# Patient Record
Sex: Female | Born: 1960 | Race: Black or African American | Hispanic: No | Marital: Married | Smoking: Never smoker
Health system: Southern US, Community
[De-identification: ages and names within clinical notes are randomized; demographics above are authoritative.]

## PROBLEM LIST (undated history)

## (undated) DIAGNOSIS — J45909 Unspecified asthma, uncomplicated: Secondary | ICD-10-CM

## (undated) DIAGNOSIS — R7303 Prediabetes: Secondary | ICD-10-CM

## (undated) DIAGNOSIS — N920 Excessive and frequent menstruation with regular cycle: Secondary | ICD-10-CM

## (undated) HISTORY — DX: Excessive and frequent menstruation with regular cycle: N92.0

## (undated) HISTORY — PX: POLYPECTOMY: SHX149

## (undated) HISTORY — DX: Unspecified asthma, uncomplicated: J45.909

## (undated) HISTORY — PX: DILATION AND CURETTAGE OF UTERUS: SHX78

## (undated) HISTORY — PX: UMBILICAL HERNIA REPAIR: SHX196

---

## 1998-09-29 HISTORY — PX: CARPAL TUNNEL RELEASE: SHX101

## 2004-09-03 ENCOUNTER — Ambulatory Visit: Payer: Self-pay | Admitting: Otolaryngology

## 2005-02-25 ENCOUNTER — Ambulatory Visit: Payer: Self-pay | Admitting: Otolaryngology

## 2006-02-16 ENCOUNTER — Ambulatory Visit: Payer: Self-pay | Admitting: Internal Medicine

## 2006-09-14 ENCOUNTER — Emergency Department: Payer: Self-pay | Admitting: Emergency Medicine

## 2007-03-18 ENCOUNTER — Ambulatory Visit: Payer: Self-pay | Admitting: Internal Medicine

## 2007-11-17 ENCOUNTER — Emergency Department: Payer: Self-pay | Admitting: Emergency Medicine

## 2010-03-07 ENCOUNTER — Ambulatory Visit: Payer: Self-pay | Admitting: Internal Medicine

## 2010-04-29 ENCOUNTER — Ambulatory Visit: Payer: Self-pay | Admitting: Internal Medicine

## 2010-05-14 ENCOUNTER — Ambulatory Visit: Payer: Self-pay | Admitting: Internal Medicine

## 2010-05-30 ENCOUNTER — Ambulatory Visit: Payer: Self-pay | Admitting: Internal Medicine

## 2010-08-08 ENCOUNTER — Emergency Department: Payer: Self-pay | Admitting: Emergency Medicine

## 2010-08-11 ENCOUNTER — Emergency Department: Payer: Self-pay | Admitting: Emergency Medicine

## 2010-08-16 ENCOUNTER — Emergency Department: Payer: Self-pay | Admitting: Emergency Medicine

## 2012-12-23 ENCOUNTER — Ambulatory Visit: Payer: Self-pay | Admitting: Gastroenterology

## 2012-12-24 LAB — PATHOLOGY REPORT

## 2013-09-29 HISTORY — PX: BREAST BIOPSY: SHX20

## 2014-05-10 ENCOUNTER — Ambulatory Visit: Payer: Self-pay | Admitting: Internal Medicine

## 2014-05-12 ENCOUNTER — Ambulatory Visit: Payer: Self-pay | Admitting: Internal Medicine

## 2014-05-15 ENCOUNTER — Ambulatory Visit: Payer: Self-pay | Admitting: Internal Medicine

## 2014-05-18 LAB — PATHOLOGY REPORT

## 2014-11-20 ENCOUNTER — Ambulatory Visit: Payer: Self-pay | Admitting: Internal Medicine

## 2015-06-20 ENCOUNTER — Other Ambulatory Visit: Payer: Self-pay | Admitting: Internal Medicine

## 2015-06-20 DIAGNOSIS — Z1231 Encounter for screening mammogram for malignant neoplasm of breast: Secondary | ICD-10-CM

## 2015-06-27 ENCOUNTER — Ambulatory Visit: Payer: Self-pay

## 2015-07-10 ENCOUNTER — Ambulatory Visit
Admission: RE | Admit: 2015-07-10 | Discharge: 2015-07-10 | Disposition: A | Discharge status: Home / Self Care | Payer: Medicaid Other | Source: Ambulatory Visit | Attending: Internal Medicine | Admitting: Internal Medicine

## 2015-07-10 DIAGNOSIS — Z1231 Encounter for screening mammogram for malignant neoplasm of breast: Secondary | ICD-10-CM | POA: Insufficient documentation

## 2016-08-04 ENCOUNTER — Ambulatory Visit
Admission: RE | Admit: 2016-08-04 | Discharge: 2016-08-04 | Disposition: A | Discharge status: Home / Self Care | Payer: Self-pay | Source: Ambulatory Visit | Attending: Oncology | Admitting: Oncology

## 2016-08-04 ENCOUNTER — Ambulatory Visit: Payer: Self-pay | Attending: Oncology | Admitting: *Deleted

## 2016-08-04 VITALS — BP 131/84 | HR 73 | Temp 97.5°F | Ht 64.17 in | Wt 281.7 lb

## 2016-08-04 DIAGNOSIS — Z0000 Encounter for general adult medical examination without abnormal findings: Secondary | ICD-10-CM

## 2016-08-04 NOTE — Patient Instructions (Signed)
Gave patient hand-out, Women Staying Healthy, Active and Well from [facility], with education on breast health, pap smears, heart and colon health. 

## 2016-08-04 NOTE — Progress Notes (Signed)
Subjective:     Patient ID: Jade Gonzalez, female   DOB: 02/07/1961, 55 y.o.   MRN: [id number]  HPI   Review of Systems     Objective:   Physical Exam  Pulmonary/Chest: Right breast exhibits no inverted nipple, no mass, no nipple discharge, no skin change and no tenderness. Left breast exhibits no inverted nipple, no mass, no nipple discharge, no skin change and no tenderness. Breasts are symmetrical.  Very large pendulous breast       Assessment:     55 year old Black female presents to [facility] for clinical breast exam, and mammogram only.  Clinical breast exam unremarkable.  Patient has very large pendulous breast.  Taught self breast awareness.  Patient has been screened for eligibility.  She does not have any insurance, Medicare or Medicaid.  She also meets financial eligibility.  Hand-out given on the Affordable Care Act.    Plan:     Screening mammogram ordered.  Will follow-up per [facility] protocol.

## 2016-08-05 ENCOUNTER — Encounter: Payer: Self-pay | Admitting: *Deleted

## 2016-08-05 NOTE — Progress Notes (Signed)
Letter mailed from [facility] to inform patient of her normal mammogram results.  Patient is to follow-up with annual screening in one year.  HSIS to Christy. 

## 2019-08-02 ENCOUNTER — Other Ambulatory Visit: Payer: Self-pay | Admitting: Internal Medicine

## 2019-08-02 DIAGNOSIS — Z1231 Encounter for screening mammogram for malignant neoplasm of breast: Secondary | ICD-10-CM

## 2020-02-01 ENCOUNTER — Ambulatory Visit
Admission: RE | Admit: 2020-02-01 | Discharge: 2020-02-01 | Disposition: A | Discharge status: Home / Self Care | Payer: Medicare Other | Source: Ambulatory Visit | Attending: Internal Medicine | Admitting: Internal Medicine

## 2020-02-01 DIAGNOSIS — Z1231 Encounter for screening mammogram for malignant neoplasm of breast: Secondary | ICD-10-CM | POA: Diagnosis present

## 2021-05-10 ENCOUNTER — Other Ambulatory Visit: Payer: Self-pay | Admitting: Internal Medicine

## 2021-05-10 DIAGNOSIS — Z1231 Encounter for screening mammogram for malignant neoplasm of breast: Secondary | ICD-10-CM

## 2021-07-09 ENCOUNTER — Ambulatory Visit
Admission: RE | Admit: 2021-07-09 | Discharge: 2021-07-09 | Disposition: A | Discharge status: Home / Self Care | Payer: Medicare Other | Source: Ambulatory Visit | Attending: Internal Medicine | Admitting: Internal Medicine

## 2021-07-09 ENCOUNTER — Other Ambulatory Visit: Payer: Self-pay

## 2021-07-09 DIAGNOSIS — Z1231 Encounter for screening mammogram for malignant neoplasm of breast: Secondary | ICD-10-CM | POA: Insufficient documentation

## 2022-04-28 ENCOUNTER — Other Ambulatory Visit: Payer: Self-pay

## 2022-06-03 ENCOUNTER — Other Ambulatory Visit: Payer: Self-pay | Admitting: Internal Medicine

## 2022-06-03 DIAGNOSIS — Z1231 Encounter for screening mammogram for malignant neoplasm of breast: Secondary | ICD-10-CM

## 2022-07-10 ENCOUNTER — Ambulatory Visit
Admission: RE | Admit: 2022-07-10 | Discharge: 2022-07-10 | Disposition: A | Discharge status: Home / Self Care | Payer: Medicare Other | Source: Ambulatory Visit | Attending: Internal Medicine | Admitting: Internal Medicine

## 2022-07-10 DIAGNOSIS — Z1231 Encounter for screening mammogram for malignant neoplasm of breast: Secondary | ICD-10-CM | POA: Diagnosis present

## 2022-07-29 ENCOUNTER — Encounter: Payer: Self-pay | Admitting: Pharmacy Technician

## 2022-07-29 NOTE — Patient Outreach (Signed)
Received 5 Boxes of Ozempic for patient.  Dr. Emily Filbert at [facility] ordered medication for patient.  Contacted [employer] office and made aware that medication needed to be picked-up.  Jacquelynn Cree Patient Advocate Specialist [place] at [facility]

## 2023-04-07 ENCOUNTER — Other Ambulatory Visit: Payer: Self-pay | Admitting: Internal Medicine

## 2023-04-07 DIAGNOSIS — M5116 Intervertebral disc disorders with radiculopathy, lumbar region: Secondary | ICD-10-CM

## 2023-04-15 ENCOUNTER — Ambulatory Visit
Admission: RE | Admit: 2023-04-15 | Discharge: 2023-04-15 | Disposition: A | Discharge status: Home / Self Care | Payer: Medicare Other | Source: Ambulatory Visit | Attending: Internal Medicine | Admitting: Internal Medicine

## 2023-04-15 DIAGNOSIS — M5116 Intervertebral disc disorders with radiculopathy, lumbar region: Secondary | ICD-10-CM | POA: Insufficient documentation

## 2023-07-02 ENCOUNTER — Other Ambulatory Visit: Payer: Self-pay | Admitting: Internal Medicine

## 2023-07-02 DIAGNOSIS — Z1231 Encounter for screening mammogram for malignant neoplasm of breast: Secondary | ICD-10-CM

## 2023-07-06 ENCOUNTER — Inpatient Hospital Stay
Admission: RE | Admit: 2023-07-06 | Discharge: 2023-07-06 | Disposition: A | Discharge status: Home / Self Care | Payer: Self-pay | Source: Ambulatory Visit | Attending: Neurosurgery | Admitting: Neurosurgery

## 2023-07-06 ENCOUNTER — Other Ambulatory Visit: Payer: Self-pay | Admitting: Family Medicine

## 2023-07-06 DIAGNOSIS — Z049 Encounter for examination and observation for unspecified reason: Secondary | ICD-10-CM

## 2023-07-10 NOTE — Progress Notes (Signed)
Referring Physician:  Danella Penton, MD [address] [facility] [place],  [place] [address]  Primary Physician:  Danella Penton, MD  History of Present Illness: 07/13/2023 Jade Gonzalez is here today with a chief complaint of  low back pain that radiates into the left buttock and into the leg and foot. No right leg pain.  She states that this has been going on for approximately the last 8 months.  She feels throbbing in her back and down her leg.  Approximately 7 out of 10 in severity.  Worsens with walking and upright activities.  She has not noted anything to make it better.  She has not noticed any sensation change or weakness.  No bowel or bladder dysfunction.  She has been working in [employer] but no formal therapy.  She has had injections as well as medical management.  Conservative measures: Physical therapy: water aerobics for 3 years, but no physical therapy Multimodal medical therapy including regular antiinflammatories:  celebrex, gabapentin     Injections: has received epidural steroid injections 05/15/2023: Left L4-5 and left S1 transforaminal ESI (unable to cannulate the left L5-S1 foramen)   I have utilized the care everywhere function in epic to review the outside records available from external health systems.  Review of Systems:  A 10 point review of systems is negative, except for the pertinent positives and negatives detailed in the HPI.  Past Medical History: Past Medical History:  Diagnosis Date   Asthma, moderate    Polymenorrhea     Past Surgical History: Past Surgical History:  Procedure Laterality Date   BREAST BIOPSY Right 2015   CARPAL TUNNEL RELEASE  2000   DILATION AND CURETTAGE OF UTERUS     POLYPECTOMY     UMBILICAL HERNIA REPAIR      Allergies: Allergies as of 07/13/2023 - Review Complete 07/13/2023  Allergen Reaction Noted   Etodolac Rash 02/07/2014    Medications:  Current Outpatient  Medications:    albuterol (VENTOLIN HFA) 108 (90 Base) MCG/ACT inhaler, Inhale 1 puff into the lungs every 4 (four) hours as needed., Disp: , Rfl:    celecoxib (CELEBREX) 200 MG capsule, Take 200 mg by mouth 2 (two) times daily., Disp: , Rfl:    fluticasone (FLONASE) 50 MCG/ACT nasal spray, Place 1 spray into both nostrils daily., Disp: , Rfl:    gabapentin (NEURONTIN) 100 MG capsule, Take 1 capsule by mouth at bedtime., Disp: , Rfl:    lovastatin (MEVACOR) 20 MG tablet, Take 20 mg by mouth at bedtime., Disp: , Rfl:    metFORMIN (GLUCOPHAGE) 500 MG tablet, Take 500 mg by mouth 2 (two) times daily with a meal., Disp: , Rfl:    montelukast (SINGULAIR) 10 MG tablet, Take 10 mg by mouth at bedtime., Disp: , Rfl:    omeprazole (PRILOSEC) 20 MG capsule, Take 20 mg by mouth 2 (two) times daily before a meal., Disp: , Rfl:    triamterene-hydrochlorothiazide (MAXZIDE-25) 37.5-25 MG tablet, Take 1 tablet by mouth daily., Disp: , Rfl:    cyanocobalamin (VITAMIN B12) 1000 MCG tablet, Take 1,000 mcg by mouth daily., Disp: , Rfl:   Social History: Social History   Tobacco Use   Smoking status: Never   Smokeless tobacco: Never  Substance Use Topics   Drug use: Never    Family Medical History: Family History  Problem Relation Age of Onset   Breast cancer Neg Hx     Physical Examination: Vitals:  07/13/23 0857  BP: 124/74    General: Patient is in no apparent distress. Attention to examination is appropriate.  Neck:   Supple.  Full range of motion.  Respiratory: Patient is breathing without any difficulty.   NEUROLOGICAL:     Awake, alert, oriented to person, place, and time.  Speech is clear and fluent.   Cranial Nerves: Pupils equal round and reactive to light.  Facial tone is symmetric.  Facial sensation is symmetric. Shoulder shrug is symmetric. Tongue protrusion is midline.    Strength:  Side Iliopsoas Quads Hamstring PF DF EHL  R 5 5 5 5 5 5   L 5 5 5  4+ 4+ 4+   Reflexes are  1+ and symmetric at the patella and achilles.   Hoffman's is absent. Clonus is absent  SLR Negative   Bilateral upper and lower extremity sensation is intact to light touch .     No evidence of dysmetria noted.  Gait is normal.    Imaging: Narrative & Impression  CLINICAL DATA:  Low back pain in both hips for 6 months   EXAM: MRI LUMBAR SPINE WITHOUT CONTRAST   TECHNIQUE: Multiplanar, multisequence MR imaging of the lumbar spine was performed. No intravenous contrast was administered.   COMPARISON:  None Available.   FINDINGS: Segmentation:  Standard.   Alignment: Grade 1 anterolisthesis of L4 on L5 secondary to facet disease. Grade 1 anterolisthesis of L3 on L4 secondary to facet disease.   Vertebrae: No acute fracture, evidence of discitis, or aggressive bone lesion.   Conus medullaris and cauda equina: Conus extends to the L1 level. Conus and cauda equina appear normal.   Paraspinal and other soft tissues: No acute paraspinal abnormality.   Disc levels:   Disc spaces: Degenerative disease with severe disc height loss and partial anterior interbody fusion at L5-S1. Mild degenerative disease with disc height loss and disc desiccation at L3-4 and L4-5. Degenerative disease with disc height loss at T11-12.   T11-12: Mild broad-based disc bulge. Mild bilateral facet arthropathy. Mild right foraminal stenosis. No left foraminal stenosis. No spinal stenosis.   T12-L1: No significant disc bulge. Mild bilateral facet arthropathy. No foraminal or central canal stenosis.   L1-L2: No significant disc bulge. Mild bilateral facet arthropathy. No foraminal or central canal stenosis.   L2-L3: Mild broad-based disc bulge. Mild bilateral facet arthropathy. No foraminal or central canal stenosis.   L3-L4: Broad-based disc bulge. Moderate bilateral facet arthropathy with ligamentum flavum infolding. Moderate spinal stenosis. Mild right and moderate left foraminal  stenosis.   L4-L5: Broad-based disc bulge eccentric towards the left. Moderate bilateral facet arthropathy with severe ligamentum flavum infolding. Severe spinal stenosis. Severe left and mild-moderate right foraminal stenosis.   L5-S1: Tiny central disc protrusion. No foraminal or central canal stenosis. Mild bilateral facet arthropathy.   IMPRESSION: 1. At L3-4 there is a broad-based disc bulge. Moderate bilateral facet arthropathy with ligamentum flavum infolding. Moderate spinal stenosis. Mild right and moderate left foraminal stenosis. 2. At L4-5 there is a broad-based disc bulge eccentric towards the left. Moderate bilateral facet arthropathy with severe ligamentum flavum infolding. Severe spinal stenosis. Severe left and mild-moderate right foraminal stenosis. 3. No acute osseous injury of the lumbar spine.     Electronically Signed   By: Elige Ko M.D.   On: 04/24/2023 07:42         I have personally reviewed the images and agree with the above interpretation.  Medical Decision Making/Assessment and Plan: Jade Gonzalez is a  pleasant 62 y.o. female with back pain radiating into her buttocks and down her leg.  She has been dealing with this for approximately 8 months.  She does have some mild weakness.  She has not noticed any significant sensory changes.  She states that this is worse while she is upright.  She has done extensive amounts of aqua aerobics but no formal physical therapy.  On physical exam she does show some mild weakness in the left lower extremity when compared to the left.  She states that she has not noticed this previously.  No signs or symptoms of cauda equina syndrome.  Her MRI shows spondylosis worse at L3-4 and L4-5 with some autofusion of L5-S1.  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  Given the significant amount of spondylosis would like to follow-up with the flexion-extension x-rays of the back as she does show some  secondary signs of possible instability.  We like to have her follow-up with physical therapy as well.  We like to see her back in approximately 2 months to gauge her improvement.  Thank you for involving me in the care of this patient.    Lovenia Kim MD/MSCR Neurosurgery

## 2023-07-13 ENCOUNTER — Encounter: Payer: Self-pay | Admitting: Neurosurgery

## 2023-07-13 ENCOUNTER — Ambulatory Visit (INDEPENDENT_AMBULATORY_CARE_PROVIDER_SITE_OTHER): Payer: Medicare Other | Admitting: Neurosurgery

## 2023-07-13 ENCOUNTER — Ambulatory Visit
Admission: RE | Admit: 2023-07-13 | Discharge: 2023-07-13 | Disposition: A | Discharge status: Home / Self Care | Payer: Medicare Other | Source: Ambulatory Visit | Attending: Neurosurgery | Admitting: Neurosurgery

## 2023-07-13 ENCOUNTER — Ambulatory Visit
Admission: RE | Admit: 2023-07-13 | Discharge: 2023-07-13 | Disposition: A | Discharge status: Home / Self Care | Payer: Medicare Other | Attending: Neurosurgery | Admitting: Neurosurgery

## 2023-07-13 VITALS — BP 124/74 | Ht 65.0 in | Wt 254.6 lb

## 2023-07-13 DIAGNOSIS — M5116 Intervertebral disc disorders with radiculopathy, lumbar region: Secondary | ICD-10-CM | POA: Diagnosis not present

## 2023-07-13 DIAGNOSIS — M4726 Other spondylosis with radiculopathy, lumbar region: Secondary | ICD-10-CM

## 2023-07-13 DIAGNOSIS — M5442 Lumbago with sciatica, left side: Secondary | ICD-10-CM | POA: Insufficient documentation

## 2023-07-13 DIAGNOSIS — M48061 Spinal stenosis, lumbar region without neurogenic claudication: Secondary | ICD-10-CM | POA: Diagnosis not present

## 2023-07-13 DIAGNOSIS — M4716 Other spondylosis with myelopathy, lumbar region: Secondary | ICD-10-CM

## 2023-07-13 DIAGNOSIS — G8929 Other chronic pain: Secondary | ICD-10-CM | POA: Insufficient documentation

## 2023-07-13 NOTE — Patient Instructions (Signed)
LOCAL PHYSICAL THERAPY  [facility]  [address]  [place], [place] [address]  [phone]  [facility]  [address] [place], [place] [address]  [phone]  [facility] (2 locations)  [address]  [place], [place] [address]  [phone]          or  [address]  [place], [place] [address]  [phone]  [facility]  [address]  [place], [place] [address]  [phone]  **dry needling**  [facility] ([facility])  [address]  [place], [place] [address]  [phone]  Fax: [phone]  ** Aquatic therapy[address] [place], [place] [address] [phone] **Aquatic therapy**  [facility]  [facility]  [address]  [place], [place] [address]  [phone]  [facility]  [address]  [place], [place] [address]  [phone]  [facility]  [address]   Janora Norlander  [place], [place] [address]  [phone]  [facility]  [address]  [place], [place] [address]  [phone]  **dry needling**   PELVIC FLOOR/SI JOINT  [facility]  Mariane Masters, PT  [email or web address]   [place]  [facility]  [address]  [address] [place], [place] [address]  [phone]   [facility] [place]  [address], [place] [address]  [phone]   [facility], [place]  [facility]  Raymond Gurney, PT  [address]  [place], [place] [address] [phone]   Samara Deist  [facility]  [address]  [phone]   [facility]  [address]  [phone]   [facility]  Annamaria Boots [facility]  [address]  [place], [place] [address]  [phone]   **No Aetna or medicaid**  [facility]  [phone] [address] [place] [address]  [phone]  [facility]  [address]  [place], [place] [address]  [phone]  Dannial Monarch, DC  [address], [place] [address]  [phone]  [facility]  [address] [place] [address]  [phone]  [facility]  [address]  [place], [place] [address]  [phone]  [facility]  (3 locations)  [address]  [place], [place] [address]  [phone]  **dry needling**           or  [address] [place], [place] [address]  [phone]  **Additionally has Gloris Manchester, OT**           or  [address]  [place], [place] [address]  [phone]  **Pediatric therapy**  [facility]  [address]  [phone]  **dry needlingVerdie Drown [facility]  [address]  [place], [place] [address]  [phone]  [facility]   (Inside [address] Fitness)  [address]  [place], [place] [address]  [phone]  **dry needling**  **MEDICAID or UNINSURED** [facility] dept. Of Physical Therapy [place], [place] [address] [phone]  [facility]  [address] [place], [place] [address]  [phone]   [facility]  [address]  [place], [place] [address]  [phone]   Doreatha Martin  [facility]  [address] [place], [place] [address]  [phone]   [facility]  [address]  [place], [place] [address]  [phone]   [facility]  [address] [place], [place] [address]  [phone]  [facility]  [address]  [place], [place] [address]  [phone]  AQUATIC  Kathalene Frames [facility]  [employer]  [facility]  [place]  [place]  *Residents only*   [facility] [employer]  *[facility]  Exercise class  [facility]  Exercise class  [facility]  [address]  [place], [place]   [phone]  [facility]  [address]  [place], [place] [address]  [phone]   [place], [place]  [facility]  [address]  [phone]   [facility]  [facility]  [address]  [phone]           or  [address]  [phone]   [facility]   Provides education and support and practical information for coping with arthritis for arthritis sufferers and their families.   When: 12:15 - 1:30 p.m. the second Monday of each month, March through December  Info: Call Rehabilitation Services at [phone]

## 2023-07-17 ENCOUNTER — Ambulatory Visit
Admission: RE | Admit: 2023-07-17 | Discharge: 2023-07-17 | Disposition: A | Discharge status: Home / Self Care | Payer: Medicare Other | Source: Ambulatory Visit | Attending: Internal Medicine | Admitting: Internal Medicine

## 2023-07-17 DIAGNOSIS — Z1231 Encounter for screening mammogram for malignant neoplasm of breast: Secondary | ICD-10-CM | POA: Diagnosis present

## 2023-09-16 ENCOUNTER — Ambulatory Visit: Payer: Medicare Other | Admitting: Neurosurgery

## 2023-10-03 IMAGING — MG MM DIGITAL SCREENING BILAT W/ TOMO AND CAD
8 of 14 series · 8 of 40 positions shown · non-contrast
Comparison: Previous exam(s).

ACR Breast Density Category a: The breast tissue is almost entirely
fatty.

CLINICAL DATA: Screening.

EXAM:
DIGITAL SCREENING BILATERAL MAMMOGRAM WITH TOMOSYNTHESIS AND CAD
TECHNIQUE: Bilateral screening digital craniocaudal and mediolateral oblique
mammograms were obtained. Bilateral screening digital breast
tomosynthesis was performed. The images were evaluated with
computer-aided detection.

[L MLO synth-2D (1 of 2)]
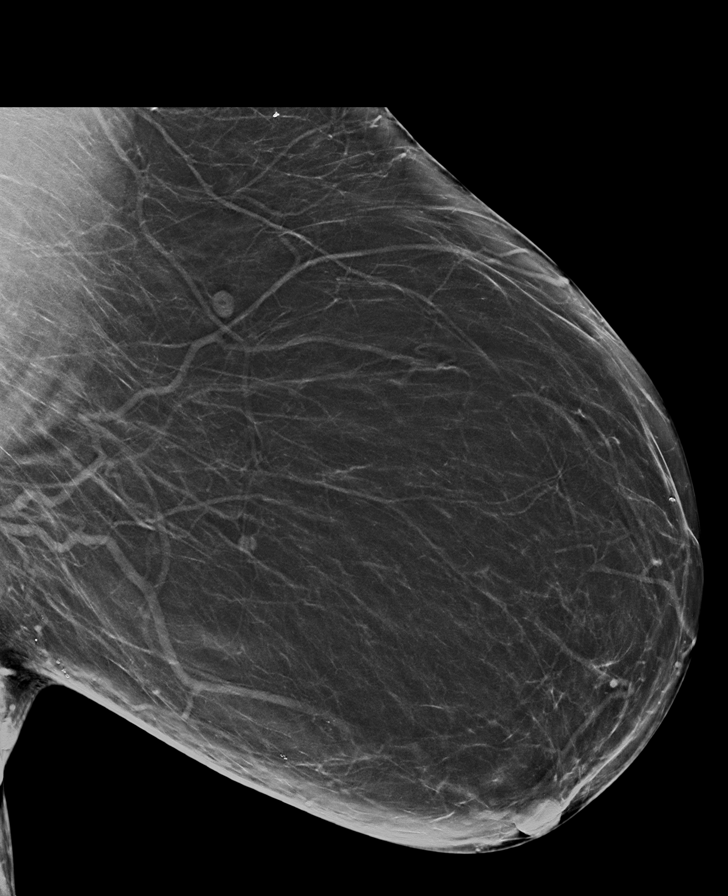

[R MLO synth-2D (1 of 2)]
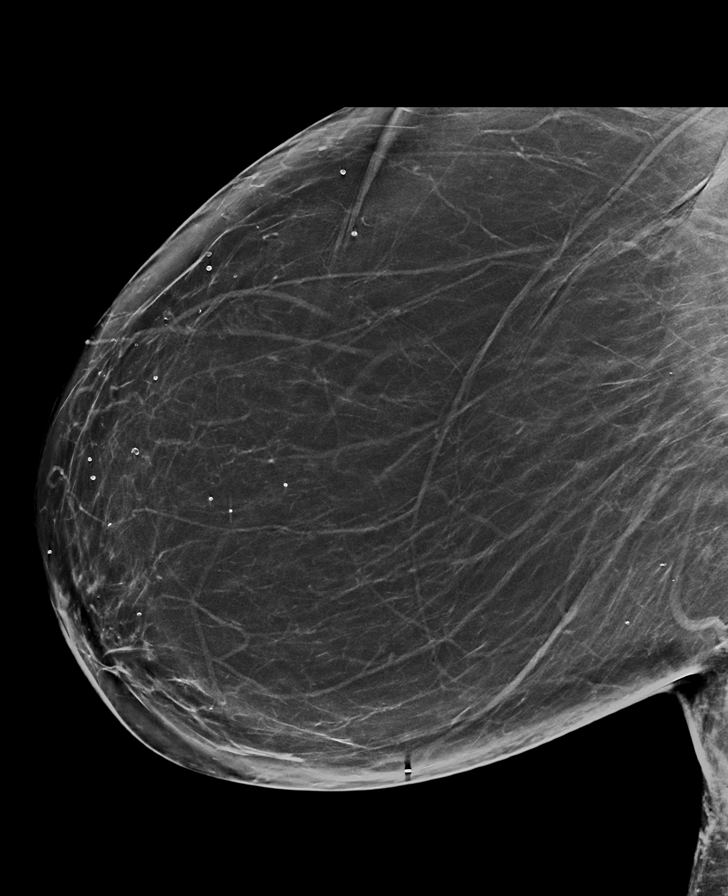

[R CC synth-2D (1 of 2)]
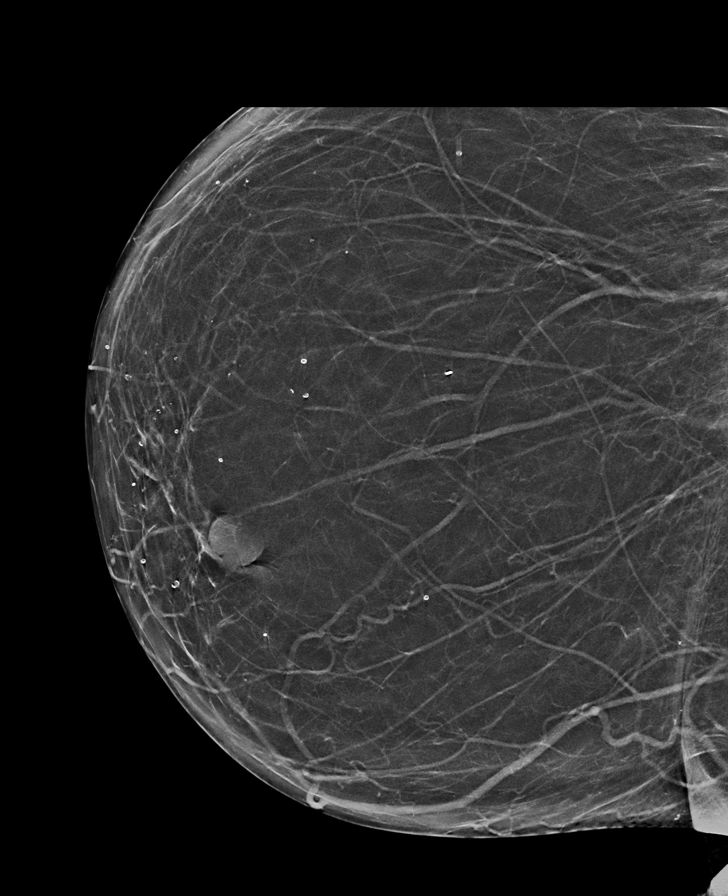

[R MLO synth-2D (2 of 2)]
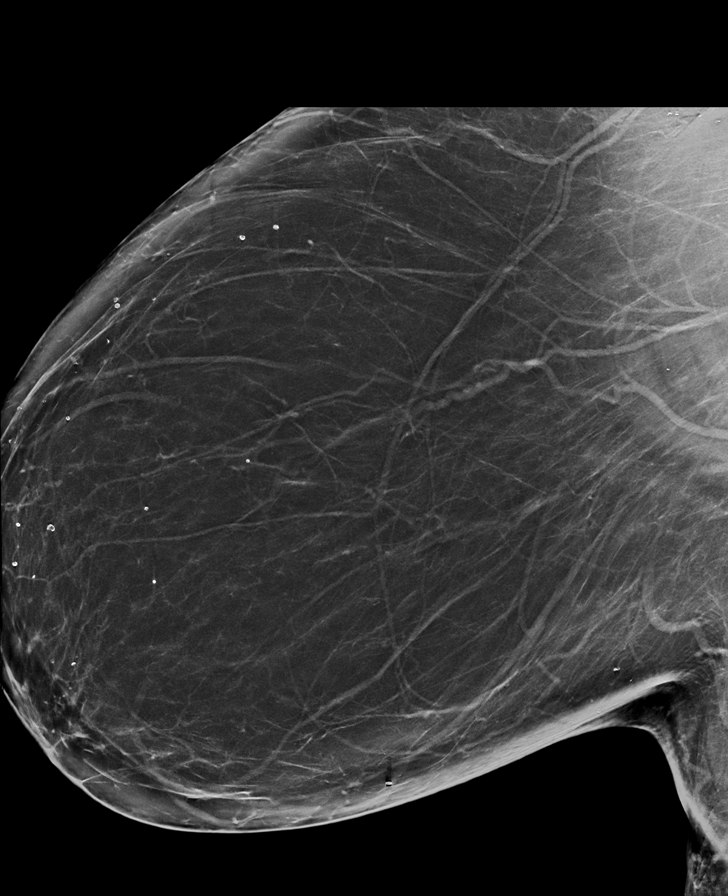

[L MLO synth-2D (2 of 2)]
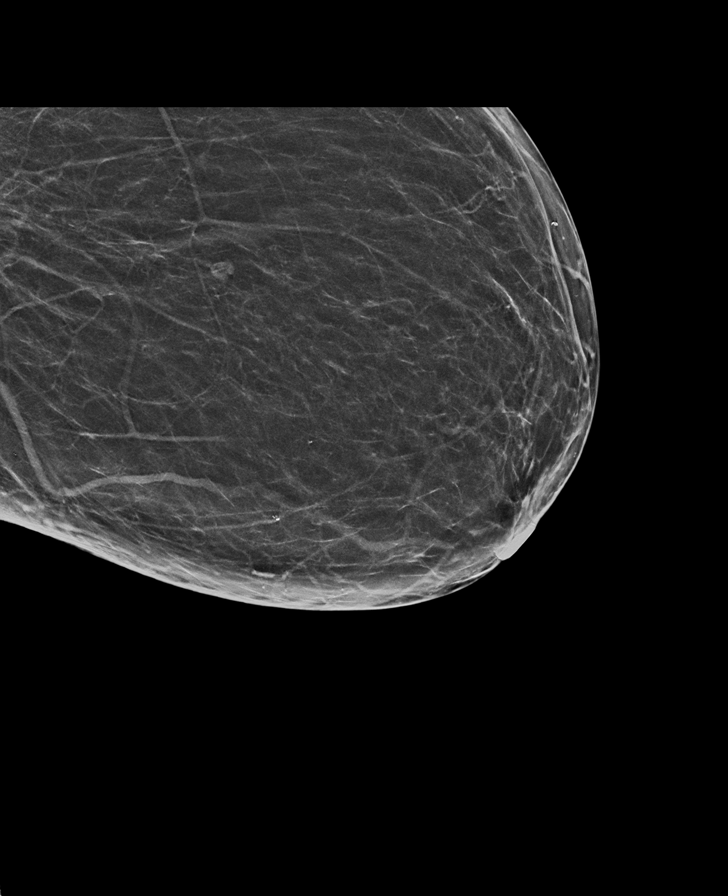

[L CC synth-2D]
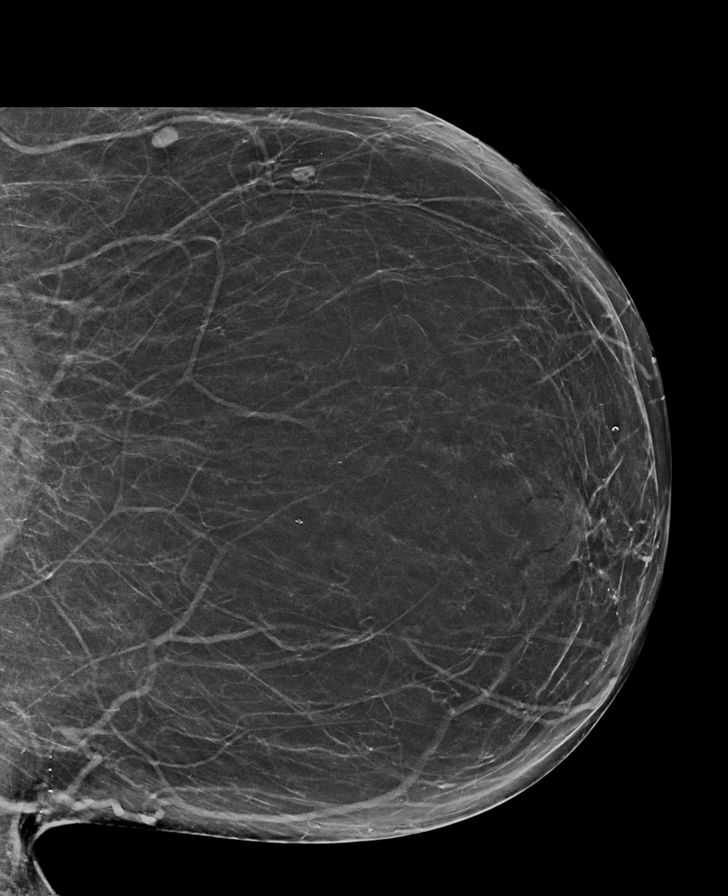

[R CC synth-2D (2 of 2)]
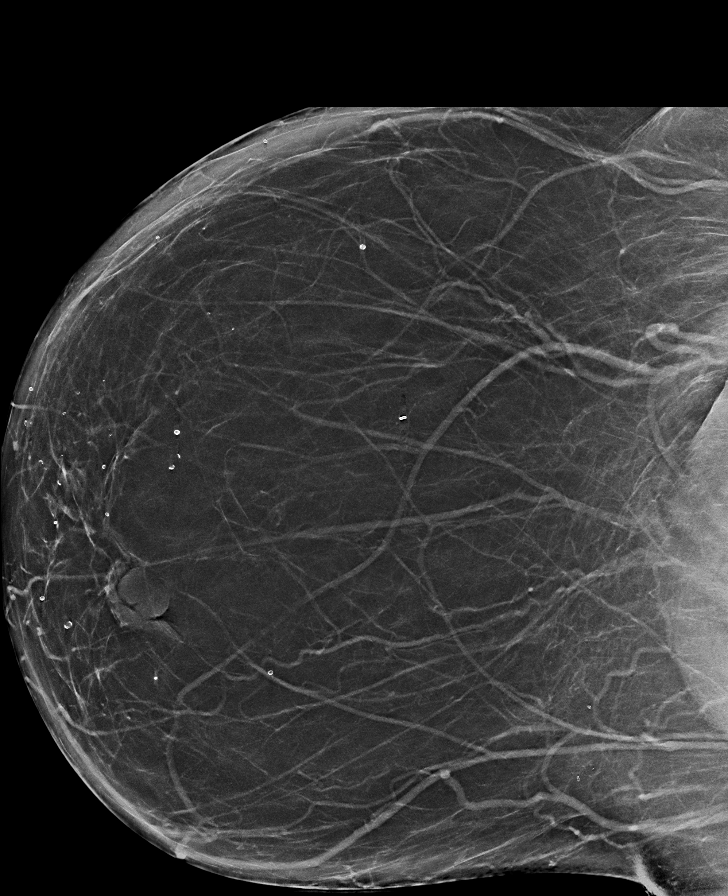

[R MLO tomo · tomo slice 41/82.0]
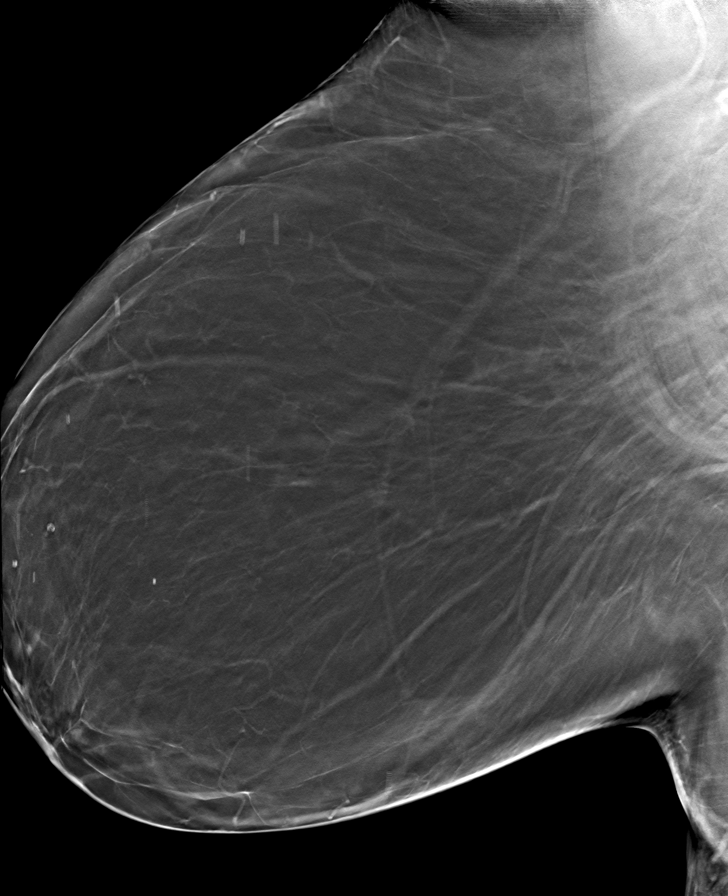

[8 of 40 positions shown; findings below may reference images not displayed]

FINDINGS: There are no findings suspicious for malignancy.
IMPRESSION: No mammographic evidence of malignancy. A result letter of this
screening mammogram will be mailed directly to the patient.

RECOMMENDATION:
Screening mammogram in one year. (Code:0E-3-N98)

BI-RADS CATEGORY  1: Negative.

## 2023-11-04 ENCOUNTER — Ambulatory Visit: Payer: Medicare Other | Admitting: Neurosurgery

## 2024-06-21 ENCOUNTER — Other Ambulatory Visit: Payer: Self-pay | Admitting: Internal Medicine

## 2024-06-21 DIAGNOSIS — Z1231 Encounter for screening mammogram for malignant neoplasm of breast: Secondary | ICD-10-CM

## 2024-07-20 ENCOUNTER — Ambulatory Visit
Admission: RE | Admit: 2024-07-20 | Discharge: 2024-07-20 | Disposition: A | Discharge status: Home / Self Care | Source: Ambulatory Visit | Attending: Internal Medicine | Admitting: Internal Medicine

## 2024-07-20 DIAGNOSIS — Z1231 Encounter for screening mammogram for malignant neoplasm of breast: Secondary | ICD-10-CM | POA: Insufficient documentation

## 2024-10-19 ENCOUNTER — Encounter
Admission: RE | Disposition: A | Discharge status: Home / Self Care | Payer: Self-pay | Source: Home / Self Care | Attending: Gastroenterology

## 2024-10-19 ENCOUNTER — Ambulatory Visit: Admitting: Anesthesiology

## 2024-10-19 ENCOUNTER — Encounter: Payer: Self-pay | Admitting: Gastroenterology

## 2024-10-19 ENCOUNTER — Ambulatory Visit
Admission: RE | Admit: 2024-10-19 | Discharge: 2024-10-19 | Disposition: A | Discharge status: Home / Self Care | Attending: Gastroenterology | Admitting: Gastroenterology

## 2024-10-19 ENCOUNTER — Other Ambulatory Visit: Payer: Self-pay

## 2024-10-19 DIAGNOSIS — Z6841 Body Mass Index (BMI) 40.0 and over, adult: Secondary | ICD-10-CM | POA: Insufficient documentation

## 2024-10-19 DIAGNOSIS — E66813 Obesity, class 3: Secondary | ICD-10-CM | POA: Insufficient documentation

## 2024-10-19 DIAGNOSIS — Z860101 Personal history of adenomatous and serrated colon polyps: Secondary | ICD-10-CM | POA: Insufficient documentation

## 2024-10-19 DIAGNOSIS — Z1211 Encounter for screening for malignant neoplasm of colon: Secondary | ICD-10-CM | POA: Insufficient documentation

## 2024-10-19 DIAGNOSIS — J4540 Moderate persistent asthma, uncomplicated: Secondary | ICD-10-CM | POA: Insufficient documentation

## 2024-10-19 DIAGNOSIS — Z7984 Long term (current) use of oral hypoglycemic drugs: Secondary | ICD-10-CM | POA: Diagnosis not present

## 2024-10-19 DIAGNOSIS — E119 Type 2 diabetes mellitus without complications: Secondary | ICD-10-CM | POA: Insufficient documentation

## 2024-10-19 HISTORY — DX: Prediabetes: R73.03

## 2024-10-19 HISTORY — PX: COLONOSCOPY: SHX5424

## 2024-10-19 LAB — GLUCOSE, CAPILLARY: Glucose-Capillary: 98 mg/dL (ref 70–99)

## 2024-10-19 MED ORDER — DEXMEDETOMIDINE HCL IN NACL 200 MCG/50ML IV SOLN
INTRAVENOUS | Status: DC | PRN
  Administered 2024-10-19 (×2): 12 ug via INTRAVENOUS

## 2024-10-19 MED ORDER — SODIUM CHLORIDE 0.9 % IV SOLN: INTRAVENOUS | Status: DC

## 2024-10-19 MED ORDER — PROPOFOL 10 MG/ML IV BOLUS
INTRAVENOUS | Status: DC | PRN
  Administered 2024-10-19 (×2): 20 mg via INTRAVENOUS
  Administered 2024-10-19: 60 mg via INTRAVENOUS

## 2024-10-19 MED ORDER — ALBUTEROL SULFATE HFA 108 (90 BASE) MCG/ACT IN AERS
INHALATION_SPRAY | RESPIRATORY_TRACT | Status: DC | PRN
  Administered 2024-10-19: 2 via RESPIRATORY_TRACT

## 2024-10-19 MED ORDER — LIDOCAINE HCL (CARDIAC) PF 100 MG/5ML IV SOSY
PREFILLED_SYRINGE | INTRAVENOUS | Status: DC | PRN
  Administered 2024-10-19: 100 mg via INTRAVENOUS

## 2024-10-19 MED ORDER — PROPOFOL 500 MG/50ML IV EMUL
INTRAVENOUS | Status: DC | PRN
  Administered 2024-10-19: 165 ug/kg/min via INTRAVENOUS

## 2024-10-19 MED ORDER — GLYCOPYRROLATE 0.2 MG/ML IJ SOLN
INTRAMUSCULAR | Status: DC | PRN
  Administered 2024-10-19: .2 mg via INTRAVENOUS

## 2024-10-19 NOTE — Transfer of Care (Signed)
 Immediate Anesthesia Transfer of Care Note  Patient: Jade Gonzalez  Procedure(s) Performed: COLONOSCOPY  Patient Location: Endoscopy Unit  Anesthesia Type:General  Level of Consciousness: drowsy and patient cooperative  Airway & Oxygen Therapy: Patient Spontanous Breathing and Patient connected to face mask oxygen  Post-op Assessment: Report given to RN and Post -op Vital signs reviewed and stable  Post vital signs: Reviewed and stable  Last Vitals:  Vitals Value Taken Time  BP    Temp    Pulse    Resp    SpO2      Last Pain:  Vitals:   10/19/24 1134  TempSrc: (P) Temporal  PainSc:          Complications: No notable events documented.

## 2024-10-19 NOTE — Anesthesia Postprocedure Evaluation (Signed)
"   Anesthesia Post Note  Patient: Jade Gonzalez  Procedure(s) Performed: COLONOSCOPY  Patient location during evaluation: PACU Anesthesia Type: General Level of consciousness: awake and alert Pain management: satisfactory to patient Vital Signs Assessment: post-procedure vital signs reviewed and stable Respiratory status: spontaneous breathing Cardiovascular status: stable Anesthetic complications: no   No notable events documented.   Last Vitals:  Vitals:   10/19/24 1134 10/19/24 1144  BP: (!) 92/54 104/77  Pulse: 88 87  Resp: (!) 21 17  Temp: (!) 35.7 C   SpO2: 100% 98%    Last Pain:  Vitals:   10/19/24 1144  TempSrc:   PainSc: 0-No pain                 VAN STAVEREN,Kennan Detter      "

## 2024-10-19 NOTE — Anesthesia Procedure Notes (Signed)
 Procedure Name: General with mask airway Date/Time: 10/19/2024 11:15 AM  Performed by: Ledora Duncan, CRNAPre-anesthesia Checklist: Patient identified, Emergency Drugs available, Suction available and Patient being monitored Patient Re-evaluated:Patient Re-evaluated prior to induction Oxygen Delivery Method: Simple face mask Induction Type: IV induction Placement Confirmation: positive ETCO2 and breath sounds checked- equal and bilateral Dental Injury: Teeth and Oropharynx as per pre-operative assessment

## 2024-10-19 NOTE — H&P (Signed)
 "  Ruel Kung , MD [address], [place], [place], [address] Phone: [phone] Fax: [phone]  Primary Care Physician:  Cleotilde Oneil FALCON, MD   Pre-Procedure History & Physical: HPI:  Jade Gonzalez is a 64 y.o. female is here for an colonoscopy.   Past Medical History:  Diagnosis Date   Asthma, moderate    Polymenorrhea    Pre-diabetes     Past Surgical History:  Procedure Laterality Date   BREAST BIOPSY Right 2015   CARPAL TUNNEL RELEASE  2000   DILATION AND CURETTAGE OF UTERUS     POLYPECTOMY     UMBILICAL HERNIA REPAIR      Prior to Admission medications  Medication Sig Start Date End Date Taking? Authorizing Provider  albuterol  (VENTOLIN  HFA) 108 (90 Base) MCG/ACT inhaler Inhale 1 puff into the lungs every 4 (four) hours as needed. 06/09/23  Yes [provider]  cyanocobalamin (VITAMIN B12) 1000 MCG tablet Take 1,000 mcg by mouth daily.   Yes [provider]  lovastatin (MEVACOR) 20 MG tablet Take 20 mg by mouth at bedtime. 06/09/23  Yes [provider]  metFORMIN (GLUCOPHAGE) 500 MG tablet Take 500 mg by mouth 2 (two) times daily with a meal. 03/16/23  Yes [provider]  montelukast (SINGULAIR) 10 MG tablet Take 10 mg by mouth at bedtime. 03/10/23  Yes [provider]  omeprazole (PRILOSEC) 20 MG capsule Take 20 mg by mouth 2 (two) times daily before a meal. 12/31/22  Yes [provider]  Semaglutide, 1 MG/DOSE, (OZEMPIC, 1 MG/DOSE,) 4 MG/3ML SOPN Inject into the skin.   Yes [provider]  triamterene-hydrochlorothiazide (MAXZIDE-25) 37.5-25 MG tablet Take 1 tablet by mouth daily. 01/14/23  Yes [provider]  celecoxib (CELEBREX) 200 MG capsule Take 200 mg by mouth 2 (two) times daily.    [provider]  fluticasone (FLONASE) 50 MCG/ACT nasal spray Place 1 spray into both nostrils daily. 05/21/21   [provider]  gabapentin (NEURONTIN) 100 MG capsule Take 1 capsule  by mouth at bedtime. 04/27/23 04/26/24  [provider]    Allergies as of 09/15/2024 - Review Complete 07/13/2023  Allergen Reaction Noted   Etodolac Rash 02/07/2014    Family History  Problem Relation Age of Onset   Breast cancer Neg Hx     Social History   Socioeconomic History   Marital status: Married    Spouse name: Not on file   Number of children: Not on file   Years of education: Not on file   Highest education level: Not on file  Occupational History   Not on file  Tobacco Use   Smoking status: Never   Smokeless tobacco: Never  Substance and Sexual Activity   Alcohol use: Never   Drug use: Never   Sexual activity: Not on file  Other Topics Concern   Not on file  Social History Narrative   Not on file   Social Drivers of Health   Tobacco Use: Low Risk (10/19/2024)   Patient History    Smoking Tobacco Use: Never    Smokeless Tobacco Use: Never    Passive Exposure: Not on file  Financial Resource Strain: Medium Risk (09/08/2024)   Received from [facility]   Overall Financial Resource Strain (CARDIA)    Difficulty of Paying Living Expenses: Somewhat hard  Food Insecurity: No Food Insecurity (09/08/2024)   Received from [facility]   Epic  Within the past 12 months, you worried that your food would run out before you got the money to buy more.: Never true    Within the past 12 months, the food you bought just didn't last and you didn't have money to get more.: Never true  Transportation Needs: No Transportation Needs (09/08/2024)   Received from [facility] - Transportation    In the past 12 months, has lack of transportation kept you from medical appointments or from getting medications?: No    Lack of Transportation (Non-Medical): No  Physical Activity: Sufficiently Active (04/27/2023)   Received from [facility]   Exercise Vital Sign    On average, how many days  per week do you engage in moderate to strenuous exercise (like a brisk walk)?: 3 days    On average, how many minutes do you engage in exercise at this level?: 90 min  Stress: No Stress Concern Present (04/27/2023)   Received from [facility] - Occupational Stress Questionnaire    Feeling of Stress : Only a little  Social Connections: Socially Integrated (04/27/2023)   Received from [facility]   Social Connection and Isolation Panel    In a typical week, how many times do you talk on the phone with family, friends, or neighbors?: More than three times a week    How often do you get together with friends or relatives?: More than three times a week    How often do you attend church or religious services?: More than 4 times per year    Do you belong to any clubs or organizations such as church groups, unions, fraternal or athletic groups, or school groups?: Yes    How often do you attend meetings of the clubs or organizations you belong to?: More than 4 times per year    Are you married, widowed, divorced, separated, never married, or living with a partner?: Married  Intimate Partner Violence: Not on file  Depression (PHQ2-9): Not on file  Alcohol Screen: Not on file  Housing: Low Risk  (09/08/2024)   Received from [facility]   Epic    In the last 12 months, was there a time when you were not able to pay the mortgage or rent on time?: No    In the past 12 months, how many times have you moved where you were living?: 0    At any time in the past 12 months, were you homeless or living in a shelter (including now)?: No  Utilities: Not At Risk (09/08/2024)   Received from [facility]   Epic    In the past 12 months has the electric, gas, oil, or water company threatened to shut off services in your home?: No  Health Literacy: Adequate Health Literacy (04/27/2023)   Received from  [facility]   B1300 Health Literacy    Frequency of need for help with medical instructions: Rarely    Review of Systems: See HPI, otherwise negative ROS  Physical Exam: BP 120/77   Pulse 82   Temp (!) 97 F (36.1 C) (Temporal)   Resp 16   Ht 5' 4 (1.626 m)   Wt 107.5 kg   LMP 07/18/2016   SpO2 99%   BMI 40.68 kg/m  General:   Alert,  pleasant and cooperative in NAD Head:  Normocephalic and atraumatic. Neck:  Supple;  no masses or thyromegaly. Lungs:  Clear throughout to auscultation, normal respiratory effort.    Heart:  +S1, +S2, Regular rate and rhythm, No edema. Abdomen:  Soft, nontender and nondistended. Normal bowel sounds, without guarding, and without rebound.   Neurologic:  Alert and  oriented x4;  grossly normal neurologically.  Impression/Plan: RAMEEN GOHLKE is here for an colonoscopy to be performed for surveillance due to prior history of colon polyps   Risks, benefits, limitations, and alternatives regarding  colonoscopy have been reviewed with the patient.  Questions have been answered.  All parties agreeable.   Ruel Kung, MD  10/19/2024, 10:18 AM  "

## 2024-10-19 NOTE — Op Note (Signed)
 [facility] Patient Name: Jade Gonzalez Procedure Date: 10/19/2024 10:57 AM MRN: [id number] Account #: [id number] Date of Birth: 07/23/1961 Admit Type: Outpatient Age: 64 Room: [facility] ENDO ROOM 3 Gender: Female Note Status: Finalized Instrument Name: Colon Scope [id number] Procedure:             Colonoscopy Indications:           Surveillance: Personal history of adenomatous polyps                         on last colonoscopy 5 years ago, Last colonoscopy:                         March 2014 Providers:             Ruel Kung MD, MD Referring MD:          Oneil PHEBE Pinal, MD (Referring MD) Medicines:             Monitored Anesthesia Care Complications:         No immediate complications. Procedure:             Pre-Anesthesia Assessment:                        - Prior to the procedure, a History and Physical was                         performed, and patient medications, allergies and                         sensitivities were reviewed. The patient's tolerance                         of previous anesthesia was reviewed.                        - The risks and benefits of the procedure and the                         sedation options and risks were discussed with the                         patient. All questions were answered and informed                         consent was obtained.                        - ASA Grade Assessment: II - A patient with mild                         systemic disease.                        After obtaining informed consent, the colonoscope was                         passed under direct vision. Throughout the procedure,                         the patient's  blood pressure, pulse, and oxygen                         saturations were monitored continuously. The                         Colonoscope was introduced through the anus and                         advanced to the the cecum, identified by the                          appendiceal orifice. The colonoscopy was performed                         with ease. The patient tolerated the procedure well.                         The quality of the bowel preparation was adequate. The                         ileocecal valve, appendiceal orifice, and rectum were                         photographed. Findings:      The perianal and digital rectal examinations were normal.      The entire examined colon appeared normal on direct and retroflexion       views. Impression:            - The entire examined colon is normal on direct and                         retroflexion views.                        - No specimens collected. Recommendation:        - Discharge patient to home (with escort).                        - Resume previous diet.                        - Continue present medications.                        - Repeat colonoscopy in 5 years for surveillance. Procedure Code(s):     --- Professional ---                        [phone], Colonoscopy, flexible; diagnostic, including                         collection of specimen(s) by brushing or washing, when                         performed (separate procedure) Diagnosis Code(s):     --- Professional ---                        Z86.010, Personal history of colonic polyps CPT copyright 2022 American Medical Association.  All rights reserved. The codes documented in this report are preliminary and upon coder review may  be revised to meet current compliance requirements. Ruel Kung, MD Ruel Kung MD, MD 10/19/2024 11:31:53 AM This report has been signed electronically. Number of Addenda: 0 Note Initiated On: 10/19/2024 10:57 AM Scope Withdrawal Time: 0 hours 8 minutes 29 seconds  Total Procedure Duration: 0 hours 12 minutes 58 seconds  Estimated Blood Loss:  Estimated blood loss: none.      [facility]

## 2024-10-19 NOTE — Addendum Note (Signed)
 Addendum  created 10/19/24 1338 by Ledora Duncan, CRNA   Flowsheet accepted

## 2024-10-19 NOTE — Anesthesia Preprocedure Evaluation (Signed)
"                                    Anesthesia Evaluation  Patient identified by MRN, date of birth, ID band Patient awake    Reviewed: Allergy & Precautions, NPO status , Patient's Chart, lab work & pertinent test results  Airway Mallampati: II  TM Distance: >3 FB Neck ROM: Full    Dental  (+) Partial Upper   Pulmonary neg pulmonary ROS, asthma    Pulmonary exam normal        Cardiovascular Exercise Tolerance: Good negative cardio ROS Normal cardiovascular exam Rhythm:Regular Rate:Normal     Neuro/Psych negative neurological ROS  negative psych ROS   GI/Hepatic negative GI ROS, Neg liver ROS,,,  Endo/Other  negative endocrine ROSdiabetes, Oral Hypoglycemic Agents  Class 3 obesity  Renal/GU negative Renal ROS  negative genitourinary   Musculoskeletal   Abdominal Normal abdominal exam  (+)   Peds negative pediatric ROS (+)  Hematology negative hematology ROS (+)   Anesthesia Other Findings Past Medical History: No date: Asthma, moderate No date: Polymenorrhea No date: Pre-diabetes  Past Surgical History: 2015: BREAST BIOPSY; Right 2000: CARPAL TUNNEL RELEASE No date: DILATION AND CURETTAGE OF UTERUS No date: POLYPECTOMY No date: UMBILICAL HERNIA REPAIR  BMI    Body Mass Index: 40.68 kg/m      Reproductive/Obstetrics negative OB ROS                              Anesthesia Physical Anesthesia Plan  ASA: 2  Anesthesia Plan: General   Post-op Pain Management:    Induction: Intravenous  PONV Risk Score and Plan: Propofol  infusion and TIVA  Airway Management Planned: Natural Airway and Nasal Cannula  Additional Equipment:   Intra-op Plan:   Post-operative Plan:   Informed Consent: I have reviewed the patients History and Physical, chart, labs and discussed the procedure including the risks, benefits and alternatives for the proposed anesthesia with the patient or authorized representative who has  indicated his/her understanding and acceptance.     Dental Advisory Given  Plan Discussed with: CRNA  Anesthesia Plan Comments:         Anesthesia Quick Evaluation  "
